# Patient Record
Sex: Female | Born: 1955 | Race: Black or African American | Marital: Married | Smoking: Never smoker
Health system: Southern US, Community
[De-identification: ages and names within clinical notes are randomized; demographics above are authoritative.]

## PROBLEM LIST (undated history)

## (undated) HISTORY — PX: BREAST BIOPSY: SHX20

---

## 2020-10-05 ENCOUNTER — Other Ambulatory Visit: Payer: Self-pay

## 2020-10-05 ENCOUNTER — Ambulatory Visit (INDEPENDENT_AMBULATORY_CARE_PROVIDER_SITE_OTHER): Payer: Medicare Other | Admitting: Podiatry

## 2020-10-05 ENCOUNTER — Ambulatory Visit (INDEPENDENT_AMBULATORY_CARE_PROVIDER_SITE_OTHER): Payer: Medicare Other

## 2020-10-05 DIAGNOSIS — M722 Plantar fascial fibromatosis: Secondary | ICD-10-CM

## 2020-10-05 NOTE — Progress Notes (Signed)
Subjective:  Patient ID: Alexis Henry, female    DOB: 08-07-1955,  MRN: [id number]  Chief Complaint  Patient presents with   Foot Pain    Bilateral foot pain    65 y.o. female presents with the above complaint.  Patient presents with complaint bilateral heel pain that has been going for quite some time.  Patient states it hurts with ambulation.  On the right foot hurts more in the arch.  On the left foot it hurts of the heel.  She said hurts with ambulation taken the first up in the morning.  She has not tried any treatment options for this.  She denies any other acute complaint she would like to discuss treatment options for this.   Review of Systems: Negative except as noted in the HPI. Denies N/V/F/Ch.  No past medical history on file.  Current Outpatient Medications:    albuterol (VENTOLIN HFA) 108 (90 Base) MCG/ACT inhaler, Inhale into the lungs., Disp: , Rfl:    cyanocobalamin (,VITAMIN B-12,) 1000 MCG/ML injection, , Disp: , Rfl:    EUTHYROX 125 MCG tablet, Take 125 mcg by mouth daily., Disp: , Rfl:    ferrous sulfate 325 (65 FE) MG EC tablet, Take by mouth., Disp: , Rfl:    levothyroxine (SYNTHROID) 112 MCG tablet, Take by mouth., Disp: , Rfl:    lisinopril-hydrochlorothiazide (ZESTORETIC) 20-12.5 MG tablet, Take 1 tablet by mouth daily., Disp: , Rfl:    metFORMIN (GLUCOPHAGE) 500 MG tablet, Take by mouth., Disp: , Rfl:    Potassium Bicarb & Chloride (POTASSIUM BICARBORNATE & POTASSIUM CHLORIDE) 25 MEQ disintegrating tablet, Take by mouth., Disp: , Rfl:    Potassium Gluconate 2.5 MEQ TABS, Take by mouth., Disp: , Rfl:    simvastatin (ZOCOR) 10 MG tablet, Take by mouth., Disp: , Rfl:   Social History   Tobacco Use  Smoking Status Not on file  Smokeless Tobacco Not on file    Not on File Objective:  There were no vitals filed for this visit. There is no height or weight on file to calculate BMI. Constitutional Well developed. Well nourished.  Vascular Dorsalis pedis  pulses palpable bilaterally. Posterior tibial pulses palpable bilaterally. Capillary refill normal to all digits.  No cyanosis or clubbing noted. Pedal hair growth normal.  Neurologic Normal speech. Oriented to person, place, and time. Epicritic sensation to light touch grossly present bilaterally.  Dermatologic Nails well groomed and normal in appearance. No open wounds. No skin lesions.  Orthopedic: Normal joint ROM without pain or crepitus bilaterally. No visible deformities. Tender to palpation at the calcaneal tuber bilaterally. No pain with calcaneal squeeze bilaterally. Ankle ROM diminished range of motion bilaterally. Silfverskiold Test: positive bilaterally.   Radiographs: Taken and reviewed. No acute fractures or dislocations. No evidence of stress fracture.  Plantar heel spur present. Posterior heel spur present.   Assessment:   1. Plantar fasciitis of right foot   2. Plantar fasciitis of left foot    Plan:  Patient was evaluated and treated and all questions answered.  Plantar Fasciitis, bilaterally - XR reviewed as above.  - Educated on icing and stretching. Instructions given.  - Injection delivered to the plantar fascia as below. - DME: Plantar Fascial Brace - Pharmacologic management: None  Procedure: Injection Tendon/Ligament Location: Bilateral plantar fascia at the glabrous junction; medial approach. Skin Prep: alcohol Injectate: 0.5 cc 0.5% marcaine plain, 0.5 cc of 1% Lidocaine, 0.5 cc kenalog 10. Disposition: Patient tolerated procedure well. Injection site dressed with a band-aid.  No follow-ups on file.

## 2020-10-10 ENCOUNTER — Other Ambulatory Visit: Payer: Self-pay | Admitting: Student

## 2020-10-10 DIAGNOSIS — N644 Mastodynia: Secondary | ICD-10-CM

## 2020-10-10 DIAGNOSIS — N6311 Unspecified lump in the right breast, upper outer quadrant: Secondary | ICD-10-CM

## 2020-10-17 ENCOUNTER — Ambulatory Visit: Payer: Medicare Other | Admitting: Podiatry

## 2020-10-17 ENCOUNTER — Inpatient Hospital Stay
Admission: RE | Admit: 2020-10-17 | Discharge: 2020-10-17 | Disposition: A | Discharge status: Home / Self Care | Payer: Self-pay | Source: Ambulatory Visit | Attending: *Deleted | Admitting: *Deleted

## 2020-10-17 ENCOUNTER — Other Ambulatory Visit: Payer: Self-pay | Admitting: *Deleted

## 2020-10-17 DIAGNOSIS — Z1231 Encounter for screening mammogram for malignant neoplasm of breast: Secondary | ICD-10-CM

## 2020-10-24 ENCOUNTER — Other Ambulatory Visit: Payer: Self-pay

## 2020-10-24 ENCOUNTER — Ambulatory Visit
Admission: RE | Admit: 2020-10-24 | Discharge: 2020-10-24 | Disposition: A | Discharge status: Home / Self Care | Payer: Medicare Other | Source: Ambulatory Visit | Attending: Student | Admitting: Student

## 2020-10-24 DIAGNOSIS — N644 Mastodynia: Secondary | ICD-10-CM | POA: Insufficient documentation

## 2020-10-24 DIAGNOSIS — N6311 Unspecified lump in the right breast, upper outer quadrant: Secondary | ICD-10-CM | POA: Insufficient documentation

## 2020-11-07 ENCOUNTER — Ambulatory Visit: Payer: Medicare Other | Admitting: Podiatry

## 2020-11-09 ENCOUNTER — Ambulatory Visit (INDEPENDENT_AMBULATORY_CARE_PROVIDER_SITE_OTHER): Payer: Medicare Other | Admitting: Podiatry

## 2020-11-09 ENCOUNTER — Encounter: Payer: Self-pay | Admitting: Podiatry

## 2020-11-09 ENCOUNTER — Other Ambulatory Visit: Payer: Self-pay

## 2020-11-09 DIAGNOSIS — M722 Plantar fascial fibromatosis: Secondary | ICD-10-CM | POA: Diagnosis not present

## 2020-11-09 NOTE — Progress Notes (Signed)
  Subjective:  Patient ID: Alexis Henry, female    DOB: 11-28-1955,  MRN: [id number]  Chief Complaint  Patient presents with   Foot Pain    "It's been doing much better.  I need my toenails cut."     65 y.o. female presents with the above complaint.  Patient presents with complaint and follow-up of bilateral Planter fasciitis.  She states she is doing a lot better the injection helped.  She states that it does not hurt with ambulation.  She is about 100% better.  She denies any other acute complaints.   Review of Systems: Negative except as noted in the HPI. Denies N/V/F/Ch.  No past medical history on file.  Current Outpatient Medications:    albuterol (VENTOLIN HFA) 108 (90 Base) MCG/ACT inhaler, Inhale into the lungs., Disp: , Rfl:    cyanocobalamin (,VITAMIN B-12,) 1000 MCG/ML injection, , Disp: , Rfl:    EUTHYROX 125 MCG tablet, Take 125 mcg by mouth daily., Disp: , Rfl:    ferrous sulfate 325 (65 FE) MG EC tablet, Take by mouth., Disp: , Rfl:    levothyroxine (SYNTHROID) 112 MCG tablet, Take by mouth., Disp: , Rfl:    lisinopril-hydrochlorothiazide (ZESTORETIC) 20-12.5 MG tablet, Take 1 tablet by mouth daily., Disp: , Rfl:    metFORMIN (GLUCOPHAGE) 500 MG tablet, Take by mouth., Disp: , Rfl:    Potassium Bicarb & Chloride (POTASSIUM BICARBORNATE & POTASSIUM CHLORIDE) 25 MEQ disintegrating tablet, Take by mouth., Disp: , Rfl:    Potassium Gluconate 2.5 MEQ TABS, Take by mouth., Disp: , Rfl:    simvastatin (ZOCOR) 10 MG tablet, Take by mouth., Disp: , Rfl:   Social History   Tobacco Use  Smoking Status Never  Smokeless Tobacco Never    No Known Allergies Objective:  There were no vitals filed for this visit. There is no height or weight on file to calculate BMI. Constitutional Well developed. Well nourished.  Vascular Dorsalis pedis pulses palpable bilaterally. Posterior tibial pulses palpable bilaterally. Capillary refill normal to all digits.  No cyanosis or  clubbing noted. Pedal hair growth normal.  Neurologic Normal speech. Oriented to person, place, and time. Epicritic sensation to light touch grossly present bilaterally.  Dermatologic Nails well groomed and normal in appearance. No open wounds. No skin lesions.  Orthopedic: Normal joint ROM without pain or crepitus bilaterally. No visible deformities. No further tender to palpation at the calcaneal tuber bilaterally. No pain with calcaneal squeeze bilaterally. Ankle ROM diminished range of motion bilaterally. Silfverskiold Test: positive bilaterally.   Radiographs: Taken and reviewed. No acute fractures or dislocations. No evidence of stress fracture.  Plantar heel spur present. Posterior heel spur present.   Assessment:   1. Plantar fasciitis of right foot   2. Plantar fasciitis of left foot     Plan:  Patient was evaluated and treated and all questions answered.  Plantar Fasciitis, bilaterally -Clinically healed with 1 steroid injection and bracing.  At this time I discussed shoe gear modification with over-the-counter insoles.  I discussed with her that if this recurs we can discuss other options and modalities.  If any foot and ankle issues arise in future I will asked her to come see me.  She states understanding.  No follow-ups on file.

## 2021-10-29 ENCOUNTER — Other Ambulatory Visit: Payer: Self-pay | Admitting: Internal Medicine

## 2021-10-29 DIAGNOSIS — Z1231 Encounter for screening mammogram for malignant neoplasm of breast: Secondary | ICD-10-CM

## 2021-11-23 ENCOUNTER — Ambulatory Visit
Admission: RE | Admit: 2021-11-23 | Discharge: 2021-11-23 | Disposition: A | Discharge status: Home / Self Care | Payer: Medicare Other | Source: Ambulatory Visit | Attending: Internal Medicine | Admitting: Internal Medicine

## 2021-11-23 DIAGNOSIS — Z1231 Encounter for screening mammogram for malignant neoplasm of breast: Secondary | ICD-10-CM | POA: Insufficient documentation

## 2021-12-03 ENCOUNTER — Other Ambulatory Visit: Payer: Self-pay | Admitting: Internal Medicine

## 2021-12-03 DIAGNOSIS — R928 Other abnormal and inconclusive findings on diagnostic imaging of breast: Secondary | ICD-10-CM

## 2021-12-21 ENCOUNTER — Ambulatory Visit
Admission: RE | Admit: 2021-12-21 | Discharge: 2021-12-21 | Disposition: A | Discharge status: Home / Self Care | Payer: Medicare Other | Source: Ambulatory Visit | Attending: Internal Medicine | Admitting: Internal Medicine

## 2021-12-21 DIAGNOSIS — R928 Other abnormal and inconclusive findings on diagnostic imaging of breast: Secondary | ICD-10-CM

## 2021-12-28 ENCOUNTER — Other Ambulatory Visit: Payer: Self-pay | Admitting: *Deleted

## 2021-12-28 ENCOUNTER — Inpatient Hospital Stay
Admission: RE | Admit: 2021-12-28 | Discharge: 2021-12-28 | Disposition: A | Discharge status: Home / Self Care | Payer: Self-pay | Source: Ambulatory Visit | Attending: *Deleted | Admitting: *Deleted

## 2021-12-28 DIAGNOSIS — Z1231 Encounter for screening mammogram for malignant neoplasm of breast: Secondary | ICD-10-CM

## 2022-10-07 IMAGING — MG MM DIGITAL DIAGNOSTIC UNILAT*R* W/ TOMO W/ CAD
6 series · 6 of 18 positions shown · non-contrast
Comparison: Previous exam(s).

CLINICAL DATA: Focal pain in the right breast, now resolved status
post antibiotic treatment. Provider reported palpable mass in the
upper outer quadrant on physical exam 10/10/20.

EXAM:
DIGITAL DIAGNOSTIC UNILATERAL RIGHT MAMMOGRAM WITH TOMOSYNTHESIS AND
CAD; ULTRASOUND RIGHT BREAST LIMITED
TECHNIQUE: Right digital diagnostic mammography and breast tomosynthesis was
performed. The images were evaluated with computer-aided detection.;
Targeted ultrasound examination of the right breast was performed

[R MLO synth-2D (1 of 2)]
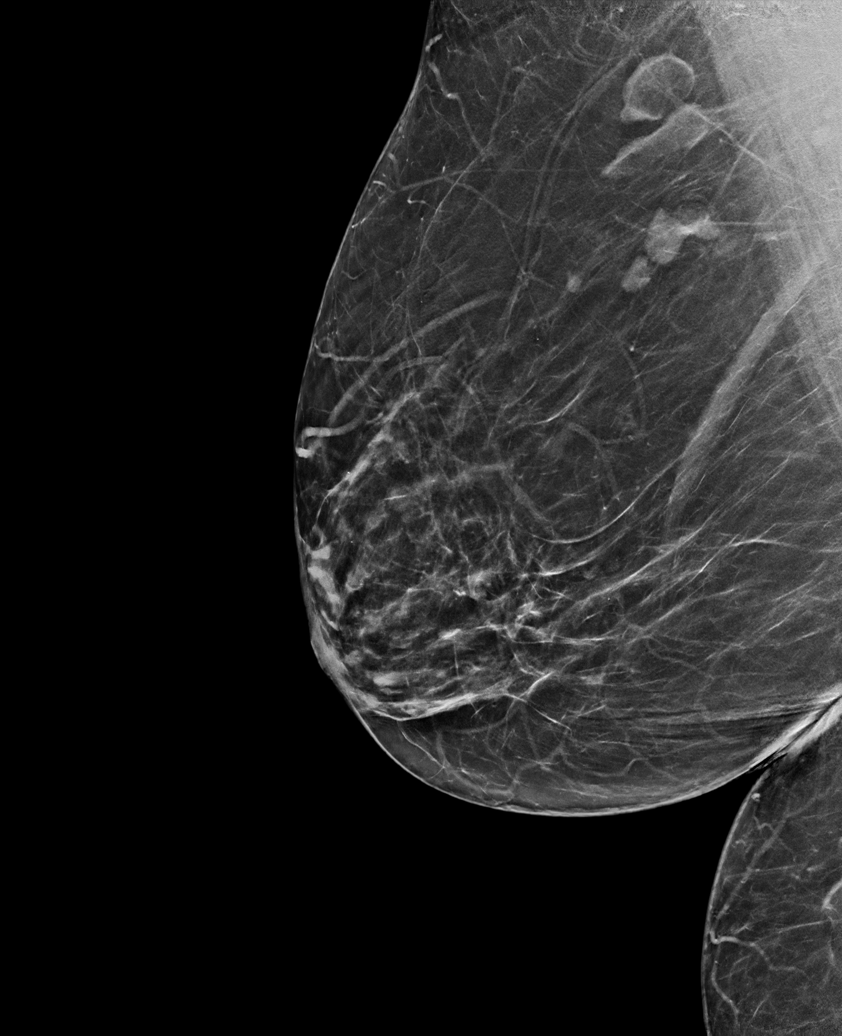

[R MLO synth-2D (2 of 2)]
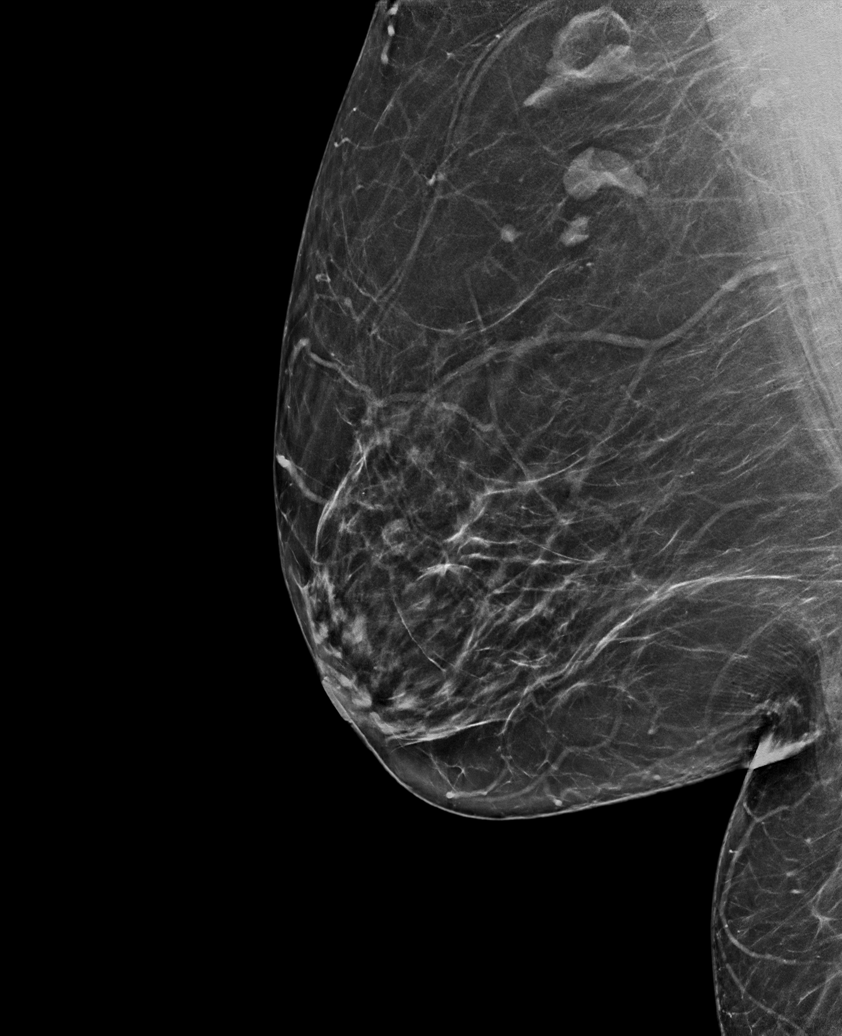

[R CC synth-2D]
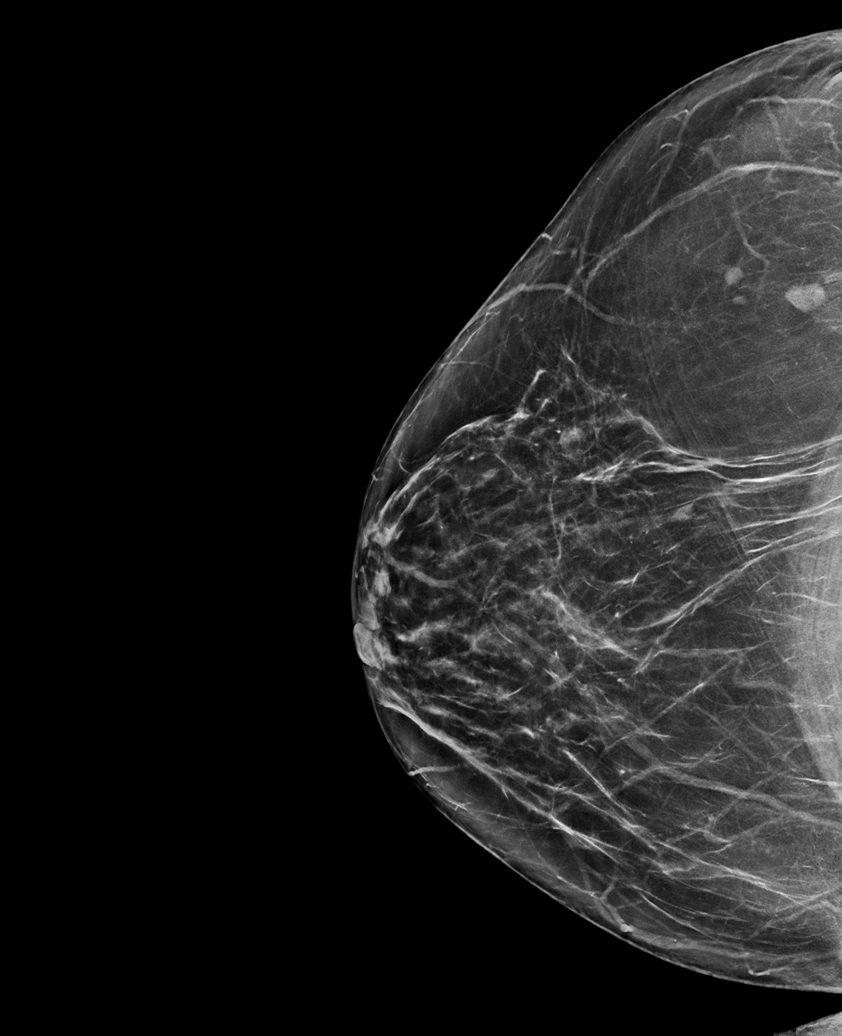

[R MLO tomo (1 of 2) · tomo slice 35/69.0]
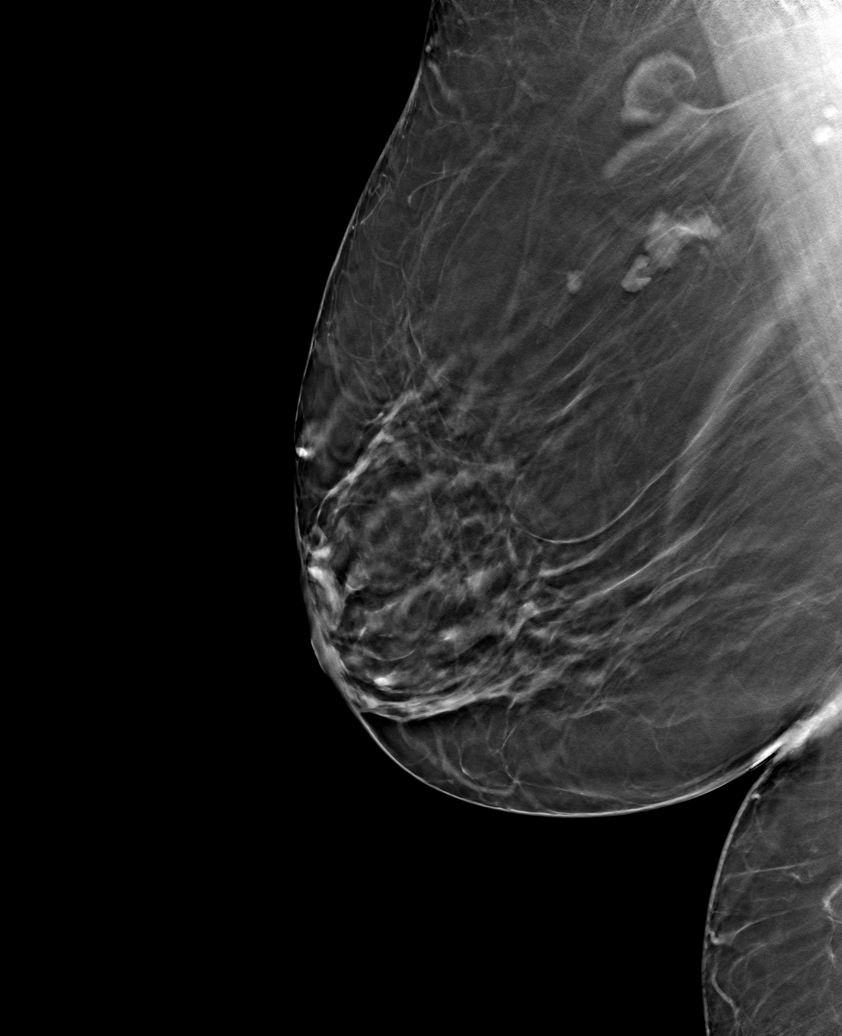

[R MLO tomo (2 of 2) · tomo slice 35/70.0]
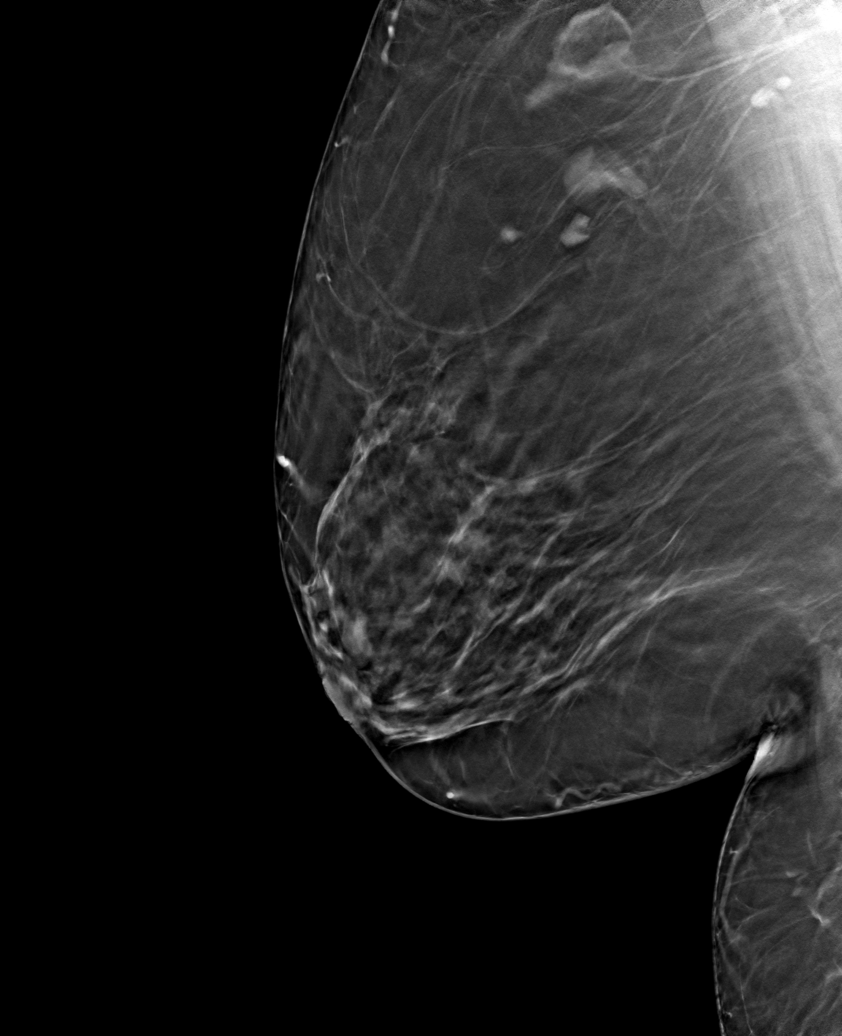

[R CC tomo · tomo slice 35/70.0]
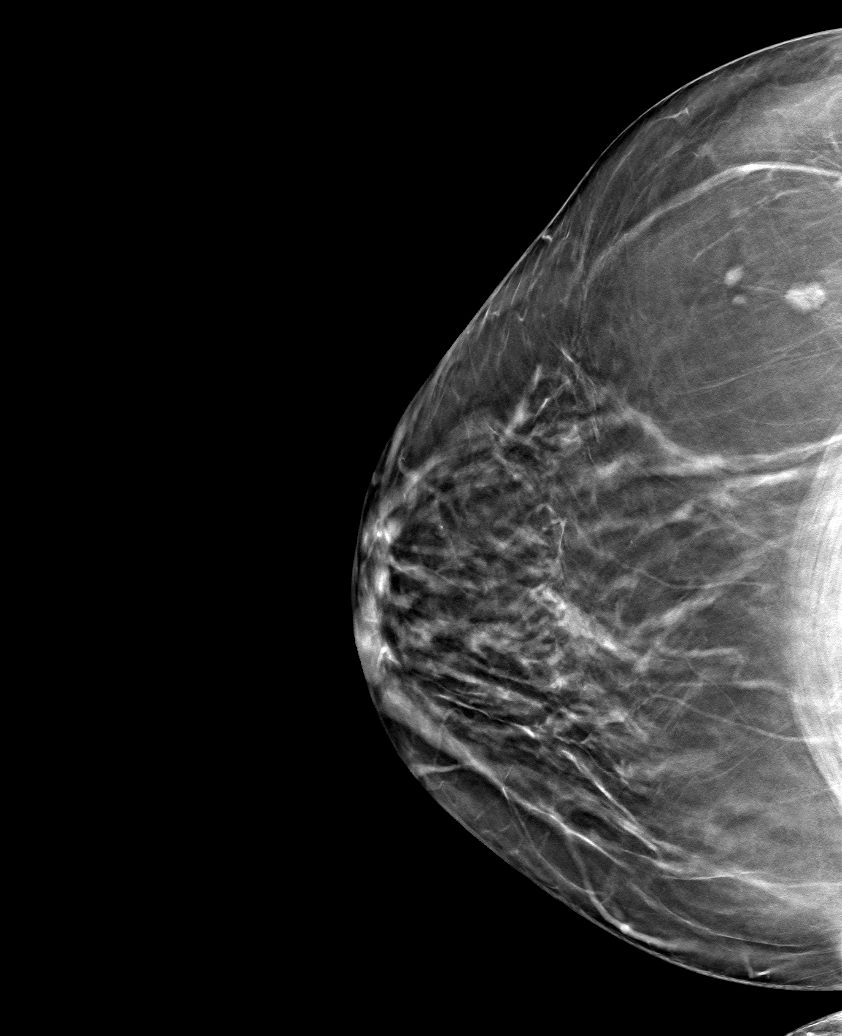

[6 of 18 positions shown; findings below may reference images not displayed]

ACR Breast Density Category b: There are scattered areas of
fibroglandular density.
FINDINGS: No suspicious mass, calcification, or other finding in the right
breast.

On physical examination of the upper right breast at the areas of
focal pain and reported palpable abnormality, I do not appreciate a
discrete mass. No overlying skin changes.

Targeted ultrasound of the right breast at the 11 o'clock position 1
cm from the nipple, at patient indicated area of resolved focal
pain, demonstrates a few ectatic ducts. No suspicious solid or
cystic mass. No fluid collection.

Targeted ultrasound of the upper-outer right breast from the [DATE] to
[DATE] positions was performed to evaluate area of provider indicated
palpable abnormality. No suspicious solid or cystic mass.
IMPRESSION: 1. No mammographic or sonographic abnormality at the patient
indicated area of focal pain in the upper right breast, now
resolved.
2. No mammographic or sonographic abnormality at the provider
indicated area of palpable abnormality upper outer right breast.

RECOMMENDATION:
Annual screening mammogram.

I have discussed the findings and recommendations with the patient.
If applicable, a reminder letter will be sent to the patient
regarding the next appointment.

BI-RADS CATEGORY  1: Negative.

## 2022-11-22 ENCOUNTER — Other Ambulatory Visit: Payer: Self-pay | Admitting: Internal Medicine

## 2022-11-22 DIAGNOSIS — Z1231 Encounter for screening mammogram for malignant neoplasm of breast: Secondary | ICD-10-CM

## 2022-11-28 ENCOUNTER — Ambulatory Visit
Admission: RE | Admit: 2022-11-28 | Discharge: 2022-11-28 | Disposition: A | Discharge status: Home / Self Care | Payer: Medicare Other | Source: Ambulatory Visit | Attending: Internal Medicine | Admitting: Internal Medicine

## 2022-11-28 DIAGNOSIS — Z1231 Encounter for screening mammogram for malignant neoplasm of breast: Secondary | ICD-10-CM | POA: Insufficient documentation

## 2023-10-27 ENCOUNTER — Other Ambulatory Visit: Payer: Self-pay | Admitting: Internal Medicine

## 2023-10-27 ENCOUNTER — Encounter: Payer: Self-pay | Admitting: Internal Medicine

## 2023-10-27 DIAGNOSIS — Z1231 Encounter for screening mammogram for malignant neoplasm of breast: Secondary | ICD-10-CM

## 2023-12-02 ENCOUNTER — Ambulatory Visit
Admission: RE | Admit: 2023-12-02 | Discharge: 2023-12-02 | Disposition: A | Discharge status: Home / Self Care | Payer: PRIVATE HEALTH INSURANCE | Source: Ambulatory Visit | Attending: Internal Medicine | Admitting: Internal Medicine

## 2023-12-02 DIAGNOSIS — Z1231 Encounter for screening mammogram for malignant neoplasm of breast: Secondary | ICD-10-CM | POA: Insufficient documentation
